# Patient Record
Sex: Female | Born: 1970
Health system: Southern US, Community
[De-identification: ages and names within clinical notes are randomized; demographics above are authoritative.]

## PROBLEM LIST (undated history)

## (undated) DIAGNOSIS — F419 Anxiety disorder, unspecified: Secondary | ICD-10-CM

## (undated) DIAGNOSIS — F988 Other specified behavioral and emotional disorders with onset usually occurring in childhood and adolescence: Secondary | ICD-10-CM

## (undated) DIAGNOSIS — I10 Essential (primary) hypertension: Secondary | ICD-10-CM

## (undated) HISTORY — PX: MICROLARYNGOSCOPY WITH CO2 LASER AND EXCISION OF VOCAL CORD LESION: SHX5970

## (undated) HISTORY — PX: ELBOW SURGERY: SHX618

## (undated) HISTORY — PX: VENTRICULOPERITONEAL SHUNT: SHX204

## (undated) HISTORY — PX: APPENDECTOMY: SHX54

## (undated) HISTORY — PX: ABDOMINAL HYSTERECTOMY: SHX81

---

## 2017-04-10 DIAGNOSIS — N951 Menopausal and female climacteric states: Secondary | ICD-10-CM | POA: Diagnosis not present

## 2017-04-10 DIAGNOSIS — I10 Essential (primary) hypertension: Secondary | ICD-10-CM | POA: Diagnosis not present

## 2017-04-10 DIAGNOSIS — R51 Headache: Secondary | ICD-10-CM | POA: Diagnosis not present

## 2017-04-10 DIAGNOSIS — G2581 Restless legs syndrome: Secondary | ICD-10-CM | POA: Diagnosis not present

## 2017-04-10 DIAGNOSIS — R609 Edema, unspecified: Secondary | ICD-10-CM | POA: Diagnosis not present

## 2017-04-10 DIAGNOSIS — Z1231 Encounter for screening mammogram for malignant neoplasm of breast: Secondary | ICD-10-CM | POA: Diagnosis not present

## 2017-04-10 DIAGNOSIS — F419 Anxiety disorder, unspecified: Secondary | ICD-10-CM | POA: Diagnosis not present

## 2017-04-10 DIAGNOSIS — M25521 Pain in right elbow: Secondary | ICD-10-CM | POA: Diagnosis not present

## 2017-04-10 DIAGNOSIS — K219 Gastro-esophageal reflux disease without esophagitis: Secondary | ICD-10-CM | POA: Diagnosis not present

## 2017-04-14 DIAGNOSIS — I10 Essential (primary) hypertension: Secondary | ICD-10-CM | POA: Diagnosis not present

## 2017-04-16 ENCOUNTER — Ambulatory Visit: Payer: Self-pay | Admitting: Adult Health

## 2017-04-17 DIAGNOSIS — R7303 Prediabetes: Secondary | ICD-10-CM | POA: Diagnosis not present

## 2017-04-17 DIAGNOSIS — I10 Essential (primary) hypertension: Secondary | ICD-10-CM | POA: Diagnosis not present

## 2017-04-17 DIAGNOSIS — F419 Anxiety disorder, unspecified: Secondary | ICD-10-CM | POA: Diagnosis not present

## 2017-04-17 DIAGNOSIS — G2581 Restless legs syndrome: Secondary | ICD-10-CM | POA: Diagnosis not present

## 2017-04-22 DIAGNOSIS — I10 Essential (primary) hypertension: Secondary | ICD-10-CM | POA: Diagnosis not present

## 2017-04-22 DIAGNOSIS — F419 Anxiety disorder, unspecified: Secondary | ICD-10-CM | POA: Diagnosis not present

## 2017-04-23 ENCOUNTER — Other Ambulatory Visit: Payer: Self-pay | Admitting: Family Medicine

## 2017-04-23 DIAGNOSIS — Z1231 Encounter for screening mammogram for malignant neoplasm of breast: Secondary | ICD-10-CM

## 2017-05-05 ENCOUNTER — Ambulatory Visit: Payer: Self-pay

## 2017-05-06 ENCOUNTER — Ambulatory Visit
Admission: RE | Admit: 2017-05-06 | Discharge: 2017-05-06 | Disposition: A | Discharge status: Home / Self Care | Payer: 59 | Source: Ambulatory Visit | Attending: Family Medicine | Admitting: Family Medicine

## 2017-05-06 DIAGNOSIS — Z1231 Encounter for screening mammogram for malignant neoplasm of breast: Secondary | ICD-10-CM

## 2017-05-18 MED FILL — GABAPENTIN 300 MG CAPSULE: 300 | 30 days supply | Qty: 30 | Fill #0

## 2017-05-18 MED FILL — CITALOPRAM HBR 40 MG TABLET: 40 | 30 days supply | Qty: 30 | Fill #0

## 2017-05-18 MED FILL — AMLODIPINE BESYLATE 5 MG TA: 5 | 90 days supply | Qty: 90 | Fill #0

## 2017-05-22 DIAGNOSIS — F902 Attention-deficit hyperactivity disorder, combined type: Secondary | ICD-10-CM | POA: Diagnosis not present

## 2017-05-22 DIAGNOSIS — F411 Generalized anxiety disorder: Secondary | ICD-10-CM | POA: Diagnosis not present

## 2017-05-22 MED FILL — traZODone HCL 100 MG TABS: 100 | 90 days supply | Qty: 90 | Fill #0

## 2017-05-22 MED FILL — SERTRALINE HCL 100 MG TAB: 100 | 30 days supply | Qty: 30 | Fill #0

## 2017-05-22 MED FILL — ALPRAZolam 0.5 MG TABS: 0.5 | 30 days supply | Qty: 30 | Fill #0

## 2017-05-25 MED FILL — VYVANSE 30 MG CAPSULE: 30 | 30 days supply | Qty: 30 | Fill #0

## 2017-06-21 ENCOUNTER — Emergency Department (HOSPITAL_COMMUNITY): Payer: 59

## 2017-06-21 ENCOUNTER — Emergency Department (HOSPITAL_COMMUNITY)
Admission: EM | Admit: 2017-06-21 | Discharge: 2017-06-21 | Disposition: A | Discharge status: Home / Self Care | Payer: 59 | Attending: Emergency Medicine | Admitting: Emergency Medicine

## 2017-06-21 ENCOUNTER — Encounter (HOSPITAL_COMMUNITY): Payer: Self-pay

## 2017-06-21 DIAGNOSIS — I10 Essential (primary) hypertension: Secondary | ICD-10-CM | POA: Diagnosis not present

## 2017-06-21 DIAGNOSIS — F1099 Alcohol use, unspecified with unspecified alcohol-induced disorder: Secondary | ICD-10-CM | POA: Diagnosis not present

## 2017-06-21 DIAGNOSIS — R112 Nausea with vomiting, unspecified: Secondary | ICD-10-CM | POA: Insufficient documentation

## 2017-06-21 DIAGNOSIS — R0789 Other chest pain: Secondary | ICD-10-CM

## 2017-06-21 DIAGNOSIS — R072 Precordial pain: Secondary | ICD-10-CM | POA: Diagnosis not present

## 2017-06-21 DIAGNOSIS — R079 Chest pain, unspecified: Secondary | ICD-10-CM | POA: Diagnosis not present

## 2017-06-21 HISTORY — DX: Anxiety disorder, unspecified: F41.9

## 2017-06-21 HISTORY — DX: Essential (primary) hypertension: I10

## 2017-06-21 HISTORY — DX: Other specified behavioral and emotional disorders with onset usually occurring in childhood and adolescence: F98.8

## 2017-06-21 LAB — URINALYSIS, ROUTINE W REFLEX MICROSCOPIC
Bilirubin Urine: NEGATIVE
GLUCOSE, UA: NEGATIVE mg/dL
Hgb urine dipstick: NEGATIVE
Ketones, ur: NEGATIVE mg/dL
LEUKOCYTES UA: NEGATIVE
Nitrite: NEGATIVE
PROTEIN: NEGATIVE mg/dL
SPECIFIC GRAVITY, URINE: 1.004 — AB (ref 1.005–1.030)
pH: 6 (ref 5.0–8.0)

## 2017-06-21 LAB — BASIC METABOLIC PANEL
Anion gap: 10 (ref 5–15)
BUN: 12 mg/dL (ref 6–20)
CALCIUM: 9.1 mg/dL (ref 8.9–10.3)
CHLORIDE: 107 mmol/L (ref 101–111)
CO2: 23 mmol/L (ref 22–32)
CREATININE: 0.91 mg/dL (ref 0.44–1.00)
GFR calc Af Amer: >60 mL/min (ref >60)
GFR calc non Af Amer: >60 mL/min (ref >60)
GLUCOSE: 107 mg/dL — AB (ref 65–99)
Potassium: 3.4 mmol/L — ABNORMAL LOW (ref 3.5–5.1)
Sodium: 140 mmol/L (ref 135–145)

## 2017-06-21 LAB — CBC
HCT: 40.8 % (ref 36.0–46.0)
Hemoglobin: 14.3 g/dL (ref 12.0–15.0)
MCH: 29.7 pg (ref 26.0–34.0)
MCHC: 35 g/dL (ref 30.0–36.0)
MCV: 84.6 fL (ref 78.0–100.0)
PLATELETS: 254 10*3/uL (ref 150–400)
RBC: 4.82 MIL/uL (ref 3.87–5.11)
RDW: 13.4 % (ref 11.5–15.5)
WBC: 14.2 10*3/uL — ABNORMAL HIGH (ref 4.0–10.5)

## 2017-06-21 LAB — I-STAT CHEM 8, ED
BUN: 13 mg/dL (ref 6–20)
CALCIUM ION: 1.13 mmol/L — AB (ref 1.15–1.40)
CHLORIDE: 105 mmol/L (ref 101–111)
CREATININE: 0.9 mg/dL (ref 0.44–1.00)
Glucose, Bld: 104 mg/dL — ABNORMAL HIGH (ref 65–99)
HCT: 40 % (ref 36.0–46.0)
Hemoglobin: 13.6 g/dL (ref 12.0–15.0)
Potassium: 3.2 mmol/L — ABNORMAL LOW (ref 3.5–5.1)
SODIUM: 141 mmol/L (ref 135–145)
TCO2: 24 mmol/L (ref 0–100)

## 2017-06-21 LAB — TROPONIN I: Troponin I: <0.03 ng/mL (ref <0.03)

## 2017-06-21 LAB — I-STAT TROPONIN, ED: Troponin i, poc: 0 ng/mL (ref 0.00–0.08)

## 2017-06-21 LAB — LIPASE, BLOOD: Lipase: 36 U/L (ref 11–51)

## 2017-06-21 LAB — D-DIMER, QUANTITATIVE (NOT AT ARMC): D DIMER QUANT: 0.45 ug{FEU}/mL (ref 0.00–0.50)

## 2017-06-21 MED ORDER — ONDANSETRON HCL 4 MG/2ML IJ SOLN
4.0000 mg | Freq: Once | INTRAMUSCULAR | Status: AC
  Administered 2017-06-21: 4 mg via INTRAVENOUS
  Filled 2017-06-21: qty 2

## 2017-06-21 MED ORDER — IOPAMIDOL (ISOVUE-370) INJECTION 76%
INTRAVENOUS | Status: AC
  Administered 2017-06-21: 100 mL
  Filled 2017-06-21: qty 100

## 2017-06-21 MED ORDER — OMEPRAZOLE 20 MG PO CPDR: 20.0000 mg | DELAYED_RELEASE_CAPSULE | Freq: Every day | ORAL | 0 refills | Status: DC

## 2017-06-21 MED ORDER — MORPHINE SULFATE (PF) 4 MG/ML IV SOLN
4.0000 mg | Freq: Once | INTRAVENOUS | Status: AC
  Administered 2017-06-21: 4 mg via INTRAVENOUS
  Filled 2017-06-21: qty 1

## 2017-06-21 MED ORDER — GI COCKTAIL ~~LOC~~
30.0000 mL | Freq: Once | ORAL | Status: AC
  Administered 2017-06-21: 30 mL via ORAL
  Filled 2017-06-21: qty 30

## 2017-06-21 MED ORDER — SODIUM CHLORIDE 0.9 % IV BOLUS (SEPSIS)
1000.0000 mL | Freq: Once | INTRAVENOUS | Status: AC
  Administered 2017-06-21: 1000 mL via INTRAVENOUS

## 2017-06-21 NOTE — ED Notes (Signed)
Patient transported to X-ray 

## 2017-06-21 NOTE — ED Provider Notes (Signed)
[facility]-EMERGENCY DEPT Provider Note   CSN: [id number] Arrival date & time: 06/21/17  4098  By signing my name below, I, Linna Darner, attest that this documentation has been prepared under the direction and in the presence of physician practitioner, Shizue Kaseman, Jeannett Senior, MD. Electronically Signed: Linna Darner, Scribe. 06/21/2017. 3:03 AM.  History   Chief Complaint Chief Complaint  Patient presents with  . Chest Pain   The history is provided by the patient. No language interpreter was used.    HPI Comments: Sylvia Walker is a 46 y.o. female with PMHx including HTN and anxiety who presents to the Emergency Department via EMS complaining of sudden onset, constant substernal chest pain that began around midnight. The pain presented while she was lying down at home. It radiates into her back and right upper extremity. The pain in the back is described as stabbing and the chest pain is described as ripping. There is associated nausea without vomiting as well as some dizziness. She notes that she had an unusual episode of diaphoresis while she was outside at a friend's house drinking alcohol (four glasses of Coke and rum) about an hour prior to onset of her chest pain. She did not have any chest pain during the episode of diaphoresis and felt normal upon leaving her friend's house. There are no modifying factors noted; patient was given nitroglycerin by EMS and also took aspirin 324 mg at home without improvement. Patient was immobilized for about 8 hours in a car yesterday. Patient has not eaten any unusual foods recently. She has no prior h/o the same. She has no h/o MI or coronary stent placement. No h/o stress test. Patient has a remote history of GERD but does not currently use any prescription medications for reflux. She denies abdominal pain or any other associated symptoms.  Past Medical History:  Diagnosis Date  . ADD (attention deficit disorder)   . Anxiety   . Hypertension     There  are no active problems to display for this patient.   History reviewed. No pertinent surgical history.  OB History    No data available       Home Medications    Prior to Admission medications   Not on File    Family History No family history on file.  Social History Social History  Substance Use Topics  . Smoking status: Not on file  . Smokeless tobacco: Not on file  . Alcohol use Yes     Allergies   Penicillins   Review of Systems Review of Systems All other systems reviewed and are negative for acute change except as noted in the HPI. Physical Exam Updated Vital Signs BP 133/80   Pulse 86   Resp 17   SpO2 93%   Physical Exam  Constitutional: She is oriented to person, place, and time. She appears well-developed and well-nourished. No distress.  Uncomfortable.  HENT:  Head: Normocephalic and atraumatic.  Mouth/Throat: Oropharynx is clear and moist. No oropharyngeal exudate.  Eyes: Pupils are equal, round, and reactive to light. Conjunctivae and EOM are normal.  Neck: Normal range of motion. Neck supple.  No meningismus.  Cardiovascular: Normal rate, regular rhythm, normal heart sounds and intact distal pulses.   No murmur heard. Pulses:      Radial pulses are 2+ on the right side, and 2+ on the left side.  Pulmonary/Chest: Effort normal and breath sounds normal. No respiratory distress.  Lungs are clear to auscultation.  Abdominal: Soft. There is no  tenderness. There is no rebound and no guarding.  Musculoskeletal: Normal range of motion. She exhibits no edema or tenderness.  Neurological: She is alert and oriented to person, place, and time. No cranial nerve deficit. She exhibits normal muscle tone. Coordination normal.  5/5 strength throughout. CN 2-12 intact.Equal grip strength.   Skin: Skin is warm.  Psychiatric: She has a normal mood and affect. Her behavior is normal.  Nursing note and vitals reviewed.  ED Treatments / Results  Labs (all  labs ordered are listed, but only abnormal results are displayed) Labs Reviewed  BASIC METABOLIC PANEL - Abnormal; Notable for the following:       Result Value   Potassium 3.4 (*)    Glucose, Bld 107 (*)    All other components within normal limits  CBC - Abnormal; Notable for the following:    WBC 14.2 (*)    All other components within normal limits  URINALYSIS, ROUTINE W REFLEX MICROSCOPIC - Abnormal; Notable for the following:    Color, Urine STRAW (*)    Specific Gravity, Urine 1.004 (*)    All other components within normal limits  I-STAT CHEM 8, ED - Abnormal; Notable for the following:    Potassium 3.2 (*)    Glucose, Bld 104 (*)    Calcium, Ion 1.13 (*)    All other components within normal limits  LIPASE, BLOOD  TROPONIN I  D-DIMER, QUANTITATIVE (NOT AT [facility])  TROPONIN I  I-STAT TROPONIN, ED  I-STAT TROPONIN, ED    EKG  EKG Interpretation  Date/Time:  Sunday June 21 2017 05:37:25 EDT Ventricular Rate:  87 PR Interval:    QRS Duration: 102 QT Interval:  394 QTC Calculation: 474 R Axis:   58 Text Interpretation:  Sinus rhythm No significant change was found Confirmed by Glynn Octaveancour, Arisa Congleton [id number]) on 06/21/2017 5:48:18 AM       Radiology Dg Chest Portable 1 View  Result Date: 06/21/2017 CLINICAL DATA:  Acute onset of generalized chest pain, radiating to the right arm and neck. Nausea and dizziness. Initial encounter. EXAM: PORTABLE CHEST 1 VIEW COMPARISON:  None. FINDINGS: The lungs are well-aerated and clear. There is no evidence of focal opacification, pleural effusion or pneumothorax. The cardiomediastinal silhouette is within normal limits. No acute osseous abnormalities are seen. IMPRESSION: No acute cardiopulmonary process seen. Electronically Signed   By: Roanna RaiderJeffery  Chang M.D.   On: 06/21/2017 03:15   Ct Angio Chest/abd/pel For Dissection W And/or Wo Contrast  Result Date: 06/21/2017 CLINICAL DATA:  Acute onset of substernal chest pain, radiating to the  right arm and back. Initial encounter. EXAM: CT ANGIOGRAPHY CHEST, ABDOMEN AND PELVIS TECHNIQUE: Multidetector CT imaging through the chest, abdomen and pelvis was performed using the standard protocol during bolus administration of intravenous contrast. Multiplanar reconstructed images and MIPs were obtained and reviewed to evaluate the vascular anatomy. CONTRAST:  100 mL of Isovue 370 IV contrast COMPARISON:  Chest radiograph performed earlier today at 3:02 a.m. FINDINGS: CTA CHEST FINDINGS Cardiovascular: There is no evidence of aortic dissection. There is no evidence of aneurysmal dilatation. Mild calcification is noted along the aortic arch. There is no evidence of significant pulmonary embolus. Mediastinum/Nodes: The heart is normal in size. Visualized mediastinal nodes are borderline normal in size. No pericardial effusion is identified. The visualized portions of the thyroid gland are unremarkable. No axillary lymphadenopathy is seen. Lungs/Pleura: The lungs are clear bilaterally. No focal consolidation, pleural effusion or pneumothorax is seen. No masses are identified. Musculoskeletal: No  acute osseous abnormalities are identified. The visualized musculature is unremarkable in appearance. Review of the MIP images confirms the above findings. CTA ABDOMEN AND PELVIS FINDINGS VASCULAR Aorta: There is no evidence of aortic dissection. There is no evidence of aneurysmal dilatation. Mild scattered calcification is noted along the distal abdominal aorta and its branches, with minimal mural thrombus. No significant luminal narrowing is seen. Celiac: The celiac trunk appears intact. SMA: The superior mesenteric artery is unremarkable in appearance. Renals: The renal arteries appear intact bilaterally. Two left-sided renal arteries are seen. IMA: The inferior mesenteric artery is unremarkable in appearance. Inflow: Scattered calcification is noted along the common and internal iliac arteries bilaterally. The  external iliac arteries appear intact bilaterally. Minimal calcification is noted along the common femoral arteries bilaterally. Veins: Visualized venous structures are grossly unremarkable in appearance. Review of the MIP images confirms the above findings. NON-VASCULAR Hepatobiliary: The liver is unremarkable in appearance. The patient is status post cholecystectomy, with clips noted at the gallbladder fossa. The common bile duct remains normal in caliber. Pancreas: The pancreas is within normal limits. Spleen: The spleen is unremarkable in appearance. Adrenals/Urinary Tract: The adrenal glands are unremarkable in appearance. The kidneys are within normal limits. There is no evidence of hydronephrosis. No renal or ureteral stones are identified. No perinephric stranding is seen. Stomach/Bowel: The stomach is unremarkable in appearance. The small bowel is within normal limits. The patient is status post appendectomy. The colon is unremarkable in appearance. Lymphatic: No retroperitoneal or pelvic sidewall lymphadenopathy is seen. Reproductive: The bladder is mildly distended. Mild soft tissue inflammation about the bladder may reflect cystitis. There is asymmetric prominence of the left ovary, measuring 5.1 cm in size. The right ovary is unremarkable in appearance. The patient is status post hysterectomy. Other: A catheter is seen extending into the upper pelvis. Its proximal end extends to the upper thoracic spine. Musculoskeletal: No acute osseous abnormalities are identified. Vacuum phenomenon is noted at L4-L5. The visualized musculature is unremarkable in appearance. Review of the MIP images confirms the above findings. IMPRESSION: 1. No evidence of aortic dissection. No evidence of aneurysmal dilatation. 2. No evidence of significant pulmonary embolus. 3. Mild soft tissue inflammation about the bladder may reflect cystitis. Would correlate for any associated symptoms. 4. Asymmetric prominence of the left  ovary, measuring 5.1 cm in size. Pelvic ultrasound could be considered for further evaluation, when and as deemed clinically appropriate. 5. Mild aortic atherosclerosis. Electronically Signed   By: Roanna Raider M.D.   On: 06/21/2017 03:50    Procedures Procedures (including critical care time)  DIAGNOSTIC STUDIES: Oxygen Saturation is 98% on RA, normal by my interpretation.    COORDINATION OF CARE: 2:55 AM Discussed treatment plan with pt at bedside and pt agreed to plan.  Medications Ordered in ED Medications  morphine 4 MG/ML injection 4 mg (4 mg Intravenous Given 06/21/17 0302)  ondansetron (ZOFRAN) injection 4 mg (4 mg Intravenous Given 06/21/17 0303)     Initial Impression / Assessment and Plan / ED Course  I have reviewed the triage vital signs and the nursing notes.  Pertinent labs & imaging results that were available during my care of the patient were reviewed by me and considered in my medical decision making (see chart for details).    Central chest pain radiating to back. Started after lying down.  Drank alcohol earlier. EKG nsr. CXR negative. Troponin negative.   CTA negative for dissection or PE.  Troponin negative x2. D-dimer negative.  Heart  score 3.    Observation admission offered but Patient does not want to be admitted.   Feels improved after treatment in the ED.  PPI and GI cocktail given.  Question esophageal spasm. Doubt ACS with unchanged EKG and two negative troponins.  No evidence of PE or aortic dissection.  Patient anxious to go home.  Start PPI. Followup with PCP for stress test. Return precautions discussed.  Final Clinical Impressions(s) / ED Diagnoses   Final diagnoses:  Atypical chest pain    New Prescriptions New Prescriptions   No medications on file  I personally performed the services described in this documentation, which was scribed in my presence. The recorded information has been reviewed and is accurate.    Glynn Octave, MD 06/22/17 [phone]

## 2017-06-21 NOTE — ED Triage Notes (Signed)
Pt from home with ems c.o substernal CP radiating to right arm and back between her shoulder blades. Pt describes it as a stabbing pain and "someone sitting on my chest" Pt nauseated, 4 zofram OTD given en route along with 324 ASA and 3 Nitros without relief of pain. Pain now 5/10. Pt alert/oriented, VSS 20GLHand, nad

## 2017-06-21 NOTE — Discharge Instructions (Signed)
There is no evidence of heart attack or blood clot in the lung. No aortic dissection. Restart your prilosec. Follow up with your doctor for a stress test. Return to the ED if you develop new or worsening symptoms.

## 2017-06-22 DIAGNOSIS — N959 Unspecified menopausal and perimenopausal disorder: Secondary | ICD-10-CM | POA: Diagnosis not present

## 2017-06-22 DIAGNOSIS — E876 Hypokalemia: Secondary | ICD-10-CM | POA: Diagnosis not present

## 2017-06-22 DIAGNOSIS — R0600 Dyspnea, unspecified: Secondary | ICD-10-CM | POA: Diagnosis not present

## 2017-06-22 DIAGNOSIS — R0789 Other chest pain: Secondary | ICD-10-CM | POA: Diagnosis not present

## 2017-06-22 DIAGNOSIS — F17210 Nicotine dependence, cigarettes, uncomplicated: Secondary | ICD-10-CM | POA: Diagnosis not present

## 2017-06-22 DIAGNOSIS — K219 Gastro-esophageal reflux disease without esophagitis: Secondary | ICD-10-CM | POA: Diagnosis not present

## 2017-06-22 MED FILL — ESTRADIOL 1 MG TABLET: 1 | 30 days supply | Qty: 30 | Fill #0

## 2017-06-23 DIAGNOSIS — F902 Attention-deficit hyperactivity disorder, combined type: Secondary | ICD-10-CM | POA: Diagnosis not present

## 2017-06-23 MED FILL — traMADol HCL 50 MG TABS: 50 | 5 days supply | Qty: 20 | Fill #0

## 2017-06-23 MED FILL — SERTRALINE HCL 100 MG TAB: 100 | 30 days supply | Qty: 30 | Fill #0

## 2017-06-23 MED FILL — VYVANSE 30 MG CAPSULE: 30 | 30 days supply | Qty: 30 | Fill #0

## 2017-06-23 MED FILL — GABAPENTIN 300 MG CAPS: 300 | 30 days supply | Qty: 30 | Fill #0

## 2017-06-23 MED FILL — ALPRAZolam 0.5 MG TABS: 0.5 | 23 days supply | Qty: 45 | Fill #0

## 2017-06-24 ENCOUNTER — Ambulatory Visit (INDEPENDENT_AMBULATORY_CARE_PROVIDER_SITE_OTHER): Payer: 59 | Admitting: Cardiology

## 2017-06-24 ENCOUNTER — Encounter: Payer: Self-pay | Admitting: Cardiology

## 2017-06-24 ENCOUNTER — Telehealth: Payer: Self-pay

## 2017-06-24 VITALS — BP 124/68 | HR 96 | Resp 10 | Ht 67.0 in | Wt 224.0 lb

## 2017-06-24 DIAGNOSIS — I7090 Unspecified atherosclerosis: Secondary | ICD-10-CM

## 2017-06-24 DIAGNOSIS — I209 Angina pectoris, unspecified: Secondary | ICD-10-CM | POA: Diagnosis not present

## 2017-06-24 DIAGNOSIS — I10 Essential (primary) hypertension: Secondary | ICD-10-CM | POA: Insufficient documentation

## 2017-06-24 DIAGNOSIS — F17210 Nicotine dependence, cigarettes, uncomplicated: Secondary | ICD-10-CM

## 2017-06-24 MED ORDER — ASPIRIN 81 MG PO CHEW: 81.0000 mg | CHEWABLE_TABLET | Freq: Every day | ORAL | 0 refills | Status: AC

## 2017-06-24 MED ORDER — NITROGLYCERIN 0.4 MG SL SUBL: 0.4000 mg | SUBLINGUAL_TABLET | SUBLINGUAL | 1 refills | Status: AC | PRN

## 2017-06-24 NOTE — Progress Notes (Signed)
Cardiology Office Note:    Date:  06/24/2017   ID:  Sylvia Walker, DOB 10/20/1971, MRN [id number]  PCP:  Walker, Marian, NP  Cardiologist:  Sylvia Ocain R Talasia Saulter, MD   Referring MD: Walker, Marian, NP    ASSESSMENT:    1. Angina pectoris (HCC)   2. Essential hypertension   3. Morbid obesity (HCC)   4. Atherosclerotic vascular disease   5. Cigarette smoker    PLAN:    In order of problems listed above:  1. Patient's symptoms are very concerning. She has multiple risk factors for coronary artery disease. Sublingual nitroglycerin protocol 911 protocol explained and she verbalized understanding. Prescription was sent.In view of the patient's symptoms, I discussed with the patient options for evaluation. Invasive and noninvasive options were given to the patient. I discussed stress testing and coronary angiography and left heart catheterization at length. Benefits, pros and cons of each approach were discussed at length. Patient had multiple questions which were answered to the patient's satisfaction. Patient opted for invasive evaluation and we will set up for coronary angiography and left heart catheterization. Further recommendations will be made based on the findings with coronary angiography. In the interim if the patient has any significant symptoms in hospital to the nearest emergency room. 2. Her blood pressure stable at this time. Diet was discussed for obesity and this of obesity explained she localized understanding. Her lipids need to be followed by her primary care physician. Once the coronary angiography stent she needs to be on statin therapy and this will have to be addressed by her primary care physician. I told her to take a coated baby aspirin on a regular basis. She'll be seen in follow-up appointment after the coronary angiography. Primary prevention stressed. I advised her to diet to lose weight and risks of obesity explained. She vocalized understanding and questions were  answered to her satisfaction. 3.   I spent 5 minutes with the patient discussing solely about smoking. Smoking cessation was counseled. I suggested to the patient also different medications and pharmacological interventions. Patient is keen to try stopping on its own at this time. He will get back to me if he needs any further assistance in this matter.   Medication Adjustments/Labs and Tests Ordered: Current medicines are reviewed at length with the patient today.  Concerns regarding medicines are outlined above.  No orders of the defined types were placed in this encounter.  No orders of the defined types were placed in this encounter.    History of Present Illness:    Sylvia Walker is a 46 y.o. female who is being seen today for the evaluatiIs on of Angina pectoris at the request of Walker, Marian, NP. Patient is a pleasant 46-year-old female. 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 She mentions to me that she has history of essential hypertension and also she has morbid obesity. She leads a sedentary lifestyle. Unfortunately she's been a heavy smoker. This is been for a very long period of time. She mentioned to me that the other day she was at a party and subsequently when she got home she has substernal chest tightness. She uses the words elephants sitting on the chest describe this. This went to the neck and to the arms. EMS was called and she was evaluated. I reviewed the hospital evaluation. CT scan of the chest ruled out pulmonary embolism but revealed atherosclerotic vascular disease and the patient is aware of it. At the time of my   evaluation she is alert awake oriented and in no distress. Her husband is accompanying her and is very supportive.  Past Medical History:  Diagnosis Date  . ADD (attention deficit disorder)   . Anxiety   . Hypertension     Past Surgical History:  Procedure Laterality Date  . ABDOMINAL HYSTERECTOMY    . APPENDECTOMY    . ELBOW SURGERY      x2  . MICROLARYNGOSCOPY WITH CO2 LASER AND EXCISION OF VOCAL CORD LESION    . VENTRICULOPERITONEAL SHUNT      Current Medications: Current Meds  Medication Sig  . ALPRAZolam (XANAX) 0.5 MG tablet Take 0.5 mg by mouth daily as needed for anxiety.   . amLODipine (NORVASC) 5 MG tablet Take 5 mg by mouth daily.  . cyclobenzaprine (FLEXERIL) 10 MG tablet Take 10 mg by mouth as needed for muscle spasms.  . diphenhydrAMINE (BENADRYL) 25 mg capsule Take 25 mg by mouth at bedtime.  . estradiol (ESTRACE) 1 MG tablet Take 1 mg by mouth daily.   . gabapentin (NEURONTIN) 300 MG capsule Take 300 mg by mouth daily.   . lisdexamfetamine (VYVANSE) 30 MG capsule Take 30 mg by mouth daily.  . Multiple Vitamin (MULTIVITAMIN WITH MINERALS) TABS tablet Take 1 tablet by mouth daily.  . omeprazole (PRILOSEC) 20 MG capsule Take 1 capsule (20 mg total) by mouth daily.  . Prenatal Vit-Fe Fumarate-FA (PRENATAL MULTIVITAMIN) TABS tablet Take 1 tablet by mouth daily at 12 noon.  . sertraline (ZOLOFT) 100 MG tablet Take 100 mg by mouth daily.  . traMADol (ULTRAM) 50 MG tablet Take by mouth as needed for severe pain.  . traZODone (DESYREL) 100 MG tablet Take 100 mg by mouth at bedtime.     Allergies:   Penicillins   Social History   Social History  . Marital status: Married    Spouse name: N/A  . Number of children: N/A  . Years of education: N/A   Social History Main Topics  . Smoking status: Current Every Day Smoker    Packs/day: 1.00  . Smokeless tobacco: Never Used  . Alcohol use Yes  . Drug use: No  . Sexual activity: Not Asked   Other Topics Concern  . None   Social History Narrative  . None     Family History: The patient's family history includes COPD in her mother; Hypertension in her mother; Prostate cancer in her father; Renal Disease in her mother; Stroke in her mother.  ROS:   Please see the history of present illness.    All other systems reviewed and are  negative.  EKGs/Labs/Other Studies Reviewed:    The following studies were reviewed today: I reviewed records from the emergency room visit and discussed this with the patient at extensive length. She had multiple questions which were answered to her satisfaction.   Recent Labs: 06/21/2017: BUN 13; Creatinine, Ser 0.90; Hemoglobin 13.6; Platelets 254; Potassium 3.2; Sodium 141  Recent Lipid Panel No results found for: CHOL, TRIG, HDL, CHOLHDL, VLDL, LDLCALC, LDLDIRECT  Physical Exam:    VS:  BP 124/68   Pulse 96   Resp 10   Ht 5' 7" (1.702 m)   Wt 224 lb (101.6 kg)   BMI 35.08 kg/m     Wt Readings from Last 3 Encounters:  06/24/17 224 lb (101.6 kg)     GEN: Patient is in no acute distress HEENT: Normal NECK: No JVD; No carotid bruits LYMPHATICS: No lymphadenopathy CARDIAC: S1 S2 regular, 2/6   systolic murmur at the apex. RESPIRATORY:  Clear to auscultation without rales, wheezing or rhonchi  ABDOMEN: Soft, non-tender, non-distended MUSCULOSKELETAL:  No edema; No deformity  SKIN: Warm and dry NEUROLOGIC:  Alert and oriented x 3 PSYCHIATRIC:  Normal affect    Signed, Breta Demedeiros R Caster Fayette, MD  06/24/2017 11:15 AM    [facility]   

## 2017-06-24 NOTE — Telephone Encounter (Signed)
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

## 2017-06-24 NOTE — Patient Instructions (Addendum)
Medication Instructions:  Your physician recommends that you continue on your current medications as directed. Please refer to the Current Medication list given to you today.  Labwork: None   Testing/Procedures: Your physician has requested that you have a cardiac catheterization. Cardiac catheterization is used to diagnose and/or treat various heart conditions. Doctors may recommend this procedure for a number of different reasons. The most common reason is to evaluate chest pain. Chest pain can be a symptom of coronary artery disease (CAD), and cardiac catheterization can show whether plaque is narrowing or blocking your heart's arteries. This procedure is also used to evaluate the valves, as well as measure the blood flow and oxygen levels in different parts of your heart. For further information please visit [email or web address]. Please follow instruction sheet, as given.   Your provider has recommended a cardiac catherization  You are scheduled for a cardiac catheterization on Friday, June 26, 2017 with Dr. Clifton JamesMcAlhany or associate.  Please arrive at the [facility] ([facility]) at [facility] at [address], [place] -  2nd Floor Short Stay on Friday, June 26, 2017 at 10:00 a.m.    Special note: Every effort is made to have your procedure done on time.   Please understand that emergencies sometimes delay a scheduled   procedure.  No food or drink after midnight on Thursday, June 25, 2017. On the morning of your procedure, take your Aspirin.    You may take your morning medications with a sip of water on the day of your procedure.  Medications to HOLD - NONE   Plan for a one night stay -- bring personal belongings.  Bring a current list of your medications and current insurance cards.  You MUST have a responsible person to drive you home. Someone MUST be with you the first 24 hours after you arrive home or your discharge will be delayed. Wear clothes that are easy  to get on and off and wear slip on shoes.    Coronary Angiogram A coronary angiogram, also called coronary angiography, is an X-ray procedure used to look at the arteries in the heart. In this procedure, a dye (contrast dye) is injected through a long, hollow tube (catheter). The catheter is about the size of a piece of cooked spaghetti and is inserted through your groin, wrist, or arm. The dye is injected into each artery, and X-rays are then taken to show if there is a blockage in the arteries of your heart.  LET [facility]ARE PROVIDER KNOW ABOUT:  Any allergies you have, including allergies to shellfish or contrast dye.   All medicines you are taking, including vitamins, herbs, eye drops, creams, and over-the-counter medicines.   Previous problems you or members of your family have had with the use of anesthetics.   Any blood disorders you have.   Previous surgeries you have had.  History of kidney problems or failure.   Other medical conditions you have.  RISKS AND COMPLICATIONS  Generally, a coronary angiogram is a safe procedure. However, about 1 person out of 1000 can have problems that may include:  Allergic reaction to the dye.  Bleeding/bruising from the access site or other locations.  Kidney injury, especially in people with impaired kidney function.  Stroke (rare).  Heart attack (rare).  Irregular rhythms (rare)  Death (rare)  BEFORE THE PROCEDURE   Do not eat or drink anything after midnight the night before the procedure or as directed by your health care provider.  Ask your health care provider about changing or stopping your regular medicines. This is especially important if you are taking diabetes medicines or blood thinners.  PROCEDURE  You may be given a medicine to help you relax (sedative) before the procedure. This medicine is given through an intravenous (IV) access tube that is inserted into one of your veins.   The area where the  catheter will be inserted will be washed and shaved. This is usually done in the groin but may be done in the fold of your arm (near your elbow) or in the wrist.   A medicine will be given to numb the area where the catheter will be inserted (local anesthetic).   The health care provider will insert the catheter into an artery. The catheter will be guided by using a special type of X-ray (fluoroscopy) of the blood vessel being examined.   A special dye will then be injected into the catheter, and X-rays will be taken. The dye will help to show where any narrowing or blockages are located in the heart arteries.    AFTER THE PROCEDURE   If the procedure is done through the leg, you will be kept in bed lying flat for several hours. You will be instructed to not bend or cross your legs.  The insertion site will be checked frequently.   The pulse in your feet or wrist will be checked frequently.   Additional blood tests, X-rays, and an electrocardiogram may be done.     Follow-Up: Your physician recommends that you schedule a follow-up appointment in: 7-10 days after the cath.   Any Other Special Instructions Will Be Listed Below (If Applicable).  Please note that any paperwork needing to be filled out by the provider will need to be addressed at the front desk prior to seeing the provider. Please note that any paperwork FMLA, Disability or other documents regarding health condition is subject to a $25.00 charge that must be received prior to completion of paperwork in the form of a money order or check.    If you need a refill on your cardiac medications before your next appointment, please call your pharmacy.

## 2017-06-24 NOTE — Addendum Note (Signed)
Addended by: Rodney LangtonITTER, JADA M on: 06/24/2017 11:42 AM   Modules accepted: Orders

## 2017-06-26 ENCOUNTER — Ambulatory Visit (HOSPITAL_COMMUNITY)
Admission: RE | Admit: 2017-06-26 | Discharge: 2017-06-26 | Disposition: A | Discharge status: Home / Self Care | Payer: 59 | Source: Ambulatory Visit | Attending: Cardiovascular Disease | Admitting: Cardiovascular Disease

## 2017-06-26 ENCOUNTER — Encounter: Payer: Self-pay | Admitting: Cardiovascular Disease

## 2017-06-26 ENCOUNTER — Encounter (HOSPITAL_COMMUNITY)
Admission: RE | Disposition: A | Discharge status: Home / Self Care | Payer: Self-pay | Source: Ambulatory Visit | Attending: Cardiovascular Disease

## 2017-06-26 DIAGNOSIS — F17210 Nicotine dependence, cigarettes, uncomplicated: Secondary | ICD-10-CM | POA: Insufficient documentation

## 2017-06-26 DIAGNOSIS — I10 Essential (primary) hypertension: Secondary | ICD-10-CM | POA: Insufficient documentation

## 2017-06-26 DIAGNOSIS — Z880 Allergy status to penicillin: Secondary | ICD-10-CM | POA: Diagnosis not present

## 2017-06-26 DIAGNOSIS — F419 Anxiety disorder, unspecified: Secondary | ICD-10-CM | POA: Diagnosis not present

## 2017-06-26 DIAGNOSIS — Z6835 Body mass index (BMI) 35.0-35.9, adult: Secondary | ICD-10-CM | POA: Diagnosis not present

## 2017-06-26 DIAGNOSIS — Z8249 Family history of ischemic heart disease and other diseases of the circulatory system: Secondary | ICD-10-CM | POA: Insufficient documentation

## 2017-06-26 DIAGNOSIS — I25119 Atherosclerotic heart disease of native coronary artery with unspecified angina pectoris: Secondary | ICD-10-CM | POA: Diagnosis not present

## 2017-06-26 DIAGNOSIS — R072 Precordial pain: Secondary | ICD-10-CM

## 2017-06-26 DIAGNOSIS — Z79899 Other long term (current) drug therapy: Secondary | ICD-10-CM | POA: Insufficient documentation

## 2017-06-26 HISTORY — PX: LEFT HEART CATH AND CORONARY ANGIOGRAPHY: CATH118249

## 2017-06-26 LAB — BASIC METABOLIC PANEL
ANION GAP: 8 (ref 5–15)
BUN: 10 mg/dL (ref 6–20)
CO2: 26 mmol/L (ref 22–32)
Calcium: 8.9 mg/dL (ref 8.9–10.3)
Chloride: 107 mmol/L (ref 101–111)
Creatinine, Ser: 0.7 mg/dL (ref 0.44–1.00)
GFR calc Af Amer: >60 mL/min (ref >60)
GLUCOSE: 108 mg/dL — AB (ref 65–99)
POTASSIUM: 3.8 mmol/L (ref 3.5–5.1)
Sodium: 141 mmol/L (ref 135–145)

## 2017-06-26 LAB — PROTIME-INR
INR: 0.94
Prothrombin Time: 12.5 seconds (ref 11.4–15.2)

## 2017-06-26 SURGERY — LEFT HEART CATH AND CORONARY ANGIOGRAPHY
Anesthesia: LOCAL

## 2017-06-26 MED ORDER — SODIUM CHLORIDE 0.9% FLUSH: 3.0000 mL | Freq: Two times a day (BID) | INTRAVENOUS | Status: DC

## 2017-06-26 MED ORDER — ONDANSETRON HCL 4 MG/2ML IJ SOLN: 4.0000 mg | Freq: Once | INTRAMUSCULAR | Status: DC | PRN

## 2017-06-26 MED ORDER — HEPARIN (PORCINE) IN NACL 2-0.9 UNIT/ML-% IJ SOLN
INTRAMUSCULAR | Status: AC
  Filled 2017-06-26: qty 500

## 2017-06-26 MED ORDER — SODIUM CHLORIDE 0.9% FLUSH: 3.0000 mL | INTRAVENOUS | Status: DC | PRN

## 2017-06-26 MED ORDER — LIDOCAINE HCL (PF) 1 % IJ SOLN
INTRAMUSCULAR | Status: DC | PRN
  Administered 2017-06-26: 2 mL

## 2017-06-26 MED ORDER — SODIUM CHLORIDE 0.9 % WEIGHT BASED INFUSION
3.0000 mL/kg/h | INTRAVENOUS | Status: AC
  Administered 2017-06-26: 3 mL/kg/h via INTRAVENOUS

## 2017-06-26 MED ORDER — HEPARIN (PORCINE) IN NACL 2-0.9 UNIT/ML-% IJ SOLN
INTRAMUSCULAR | Status: AC | PRN
  Administered 2017-06-26: 1500 mL

## 2017-06-26 MED ORDER — HEPARIN SODIUM (PORCINE) 1000 UNIT/ML IJ SOLN
INTRAMUSCULAR | Status: DC | PRN
Start: 2017-06-26 — End: 2017-06-26
  Administered 2017-06-26: 5000 [IU] via INTRAVENOUS

## 2017-06-26 MED ORDER — SODIUM CHLORIDE 0.9 % WEIGHT BASED INFUSION: 1.0000 mL/kg/h | INTRAVENOUS | Status: DC

## 2017-06-26 MED ORDER — IOPAMIDOL (ISOVUE-370) INJECTION 76%
INTRAVENOUS | Status: AC
  Filled 2017-06-26: qty 100

## 2017-06-26 MED ORDER — LIDOCAINE HCL (PF) 1 % IJ SOLN
INTRAMUSCULAR | Status: AC
  Filled 2017-06-26: qty 30

## 2017-06-26 MED ORDER — ONDANSETRON HCL 4 MG/2ML IJ SOLN
INTRAMUSCULAR | Status: AC
  Filled 2017-06-26: qty 2

## 2017-06-26 MED ORDER — MIDAZOLAM HCL 2 MG/2ML IJ SOLN
INTRAMUSCULAR | Status: AC
  Filled 2017-06-26: qty 2

## 2017-06-26 MED ORDER — SODIUM CHLORIDE 0.9 % IV SOLN: 250.0000 mL | INTRAVENOUS | Status: DC | PRN

## 2017-06-26 MED ORDER — MIDAZOLAM HCL 2 MG/2ML IJ SOLN
INTRAMUSCULAR | Status: AC
Start: 2017-06-26 — End: 2017-06-26
  Filled 2017-06-26: qty 2

## 2017-06-26 MED ORDER — MIDAZOLAM HCL 2 MG/2ML IJ SOLN
INTRAMUSCULAR | Status: DC | PRN
  Administered 2017-06-26: 2 mg via INTRAVENOUS
  Administered 2017-06-26: 1 mg via INTRAVENOUS

## 2017-06-26 MED ORDER — VERAPAMIL HCL 2.5 MG/ML IV SOLN
INTRAVENOUS | Status: AC
  Filled 2017-06-26: qty 2

## 2017-06-26 MED ORDER — VERAPAMIL HCL 2.5 MG/ML IV SOLN
INTRAVENOUS | Status: DC | PRN
  Administered 2017-06-26: 10 mL via INTRA_ARTERIAL

## 2017-06-26 MED ORDER — SODIUM CHLORIDE 0.9 % IV SOLN: INTRAVENOUS | Status: DC

## 2017-06-26 MED ORDER — FENTANYL CITRATE (PF) 100 MCG/2ML IJ SOLN
INTRAMUSCULAR | Status: DC | PRN
  Administered 2017-06-26 (×2): 25 ug via INTRAVENOUS

## 2017-06-26 MED ORDER — SODIUM CHLORIDE 0.9 % IV SOLN: INTRAVENOUS | Status: AC

## 2017-06-26 MED ORDER — ASPIRIN 81 MG PO CHEW: 81.0000 mg | CHEWABLE_TABLET | ORAL | Status: DC

## 2017-06-26 MED ORDER — NITROGLYCERIN 0.4 MG SL SUBL
SUBLINGUAL_TABLET | SUBLINGUAL | Status: AC
Start: 2017-06-26 — End: 2017-06-26
  Administered 2017-06-26: 0.4 mg
  Administered 2017-06-26: 0.4 mg via SUBLINGUAL
  Filled 2017-06-26: qty 1

## 2017-06-26 MED ORDER — ACETAMINOPHEN 500 MG PO TABS
ORAL_TABLET | ORAL | Status: AC
  Filled 2017-06-26: qty 2

## 2017-06-26 MED ORDER — FENTANYL CITRATE (PF) 100 MCG/2ML IJ SOLN
INTRAMUSCULAR | Status: AC
  Filled 2017-06-26: qty 2

## 2017-06-26 MED ORDER — IOPAMIDOL (ISOVUE-370) INJECTION 76%
INTRAVENOUS | Status: DC | PRN
  Administered 2017-06-26: 100 mL via INTRA_ARTERIAL

## 2017-06-26 MED ORDER — ACETAMINOPHEN 500 MG PO TABS
1000.0000 mg | ORAL_TABLET | Freq: Four times a day (QID) | ORAL | Status: DC | PRN
  Administered 2017-06-26: 1000 mg via ORAL
  Filled 2017-06-26 (×2): qty 2

## 2017-06-26 MED ORDER — ONDANSETRON HCL 4 MG/2ML IJ SOLN
INTRAMUSCULAR | Status: DC | PRN
  Administered 2017-06-26: 4 mg via INTRAVENOUS

## 2017-06-26 MED ORDER — NITROGLYCERIN 0.4 MG SL SUBL: 0.4000 mg | SUBLINGUAL_TABLET | SUBLINGUAL | Status: DC | PRN

## 2017-06-26 SURGICAL SUPPLY — 10 items

## 2017-06-26 NOTE — Interval H&P Note (Signed)
History and Physical Interval Note:  06/26/2017 11:19 AM  Lorella Nimrod  has presented today for cardiac cath with the diagnosis of unstable angina.  The various methods of treatment have been discussed with the patient and family. After consideration of risks, benefits and other options for treatment, the patient has consented to  Procedure(s): LEFT HEART CATH AND CORONARY ANGIOGRAPHY (N/A) as a surgical intervention .  The patient's history has been reviewed, patient examined, no change in status, stable for surgery.  I have reviewed the patient's chart and labs.  Questions were answered to the patient's satisfaction.    Cath Lab Visit (complete for each Cath Lab visit)  Clinical Evaluation Leading to the Procedure:   ACS: No.  Non-ACS:    Anginal Classification: CCS III  Anti-ischemic medical therapy: Minimal Therapy (1 class of medications)  Non-Invasive Test Results: No non-invasive testing performed  Prior CABG: No previous CABG         Verne Carrow

## 2017-06-26 NOTE — Progress Notes (Signed)
States headache is still "pounding" and states does not want further pain med

## 2017-06-26 NOTE — H&P (View-Only) (Signed)
Cardiology Office Note:    Date:  06/24/2017   ID:  Sylvia Walker, DOB [birth year], MRN [id number]  PCP:  Ronney LionHazy, Marian, NP  Cardiologist:  Garwin Brothersajan R Perkins Molina, MD   Referring MD: Ronney LionHazy, Marian, NP    ASSESSMENT:    1. Angina pectoris (HCC)   2. Essential hypertension   3. Morbid obesity (HCC)   4. Atherosclerotic vascular disease   5. Cigarette smoker    PLAN:    In order of problems listed above:  1. Patient's symptoms are very concerning. She has multiple risk factors for coronary artery disease. Sublingual nitroglycerin protocol 911 protocol explained and she verbalized understanding. Prescription was sent.In view of the patient's symptoms, I discussed with the patient options for evaluation. Invasive and noninvasive options were given to the patient. I discussed stress testing and coronary angiography and left heart catheterization at length. Benefits, pros and cons of each approach were discussed at length. Patient had multiple questions which were answered to the patient's satisfaction. Patient opted for invasive evaluation and we will set up for coronary angiography and left heart catheterization. Further recommendations will be made based on the findings with coronary angiography. In the interim if the patient has any significant symptoms in hospital to the nearest emergency room. 2. Her blood pressure stable at this time. Diet was discussed for obesity and this of obesity explained she localized understanding. Her lipids need to be followed by her primary care physician. Once the coronary angiography stent she needs to be on statin therapy and this will have to be addressed by her primary care physician. I told her to take a coated baby aspirin on a regular basis. She'll be seen in follow-up appointment after the coronary angiography. Primary prevention stressed. I advised her to diet to lose weight and risks of obesity explained. She vocalized understanding and questions were  answered to her satisfaction. 3.   I spent 5 minutes with the patient discussing solely about smoking. Smoking cessation was counseled. I suggested to the patient also different medications and pharmacological interventions. Patient is keen to try stopping on its own at this time. He will get back to me if he needs any further assistance in this matter.   Medication Adjustments/Labs and Tests Ordered: Current medicines are reviewed at length with the patient today.  Concerns regarding medicines are outlined above.  No orders of the defined types were placed in this encounter.  No orders of the defined types were placed in this encounter.    History of Present Illness:    Sylvia Walker is a 46 y.o. female who is being seen today for the evaluatiIs on of Angina pectoris at the request of Ronney LionHazy, Marian, NP. Patient is a pleasant 46 year old female. 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 She mentions to me that she has history of essential hypertension and also she has morbid obesity. She leads a sedentary lifestyle. Unfortunately she's been a heavy smoker. This is been for a very long period of time. She mentioned to me that the other day she was at a party and subsequently when she got home she has substernal chest tightness. She uses the words elephants sitting on the chest describe this. This went to the neck and to the arms. EMS was called and she was evaluated. I reviewed the hospital evaluation. CT scan of the chest ruled out pulmonary embolism but revealed atherosclerotic vascular disease and the patient is aware of it. At the time of my  evaluation she is alert awake oriented and in no distress. Her husband is accompanying her and is very supportive.  Past Medical History:  Diagnosis Date  . ADD (attention deficit disorder)   . Anxiety   . Hypertension     Past Surgical History:  Procedure Laterality Date  . ABDOMINAL HYSTERECTOMY    . APPENDECTOMY    . ELBOW SURGERY      x2  . MICROLARYNGOSCOPY WITH CO2 LASER AND EXCISION OF VOCAL CORD LESION    . VENTRICULOPERITONEAL SHUNT      Current Medications: Current Meds  Medication Sig  . ALPRAZolam (XANAX) 0.5 MG tablet Take 0.5 mg by mouth daily as needed for anxiety.   Marland Kitchen amLODipine (NORVASC) 5 MG tablet Take 5 mg by mouth daily.  . cyclobenzaprine (FLEXERIL) 10 MG tablet Take 10 mg by mouth as needed for muscle spasms.  . diphenhydrAMINE (BENADRYL) 25 mg capsule Take 25 mg by mouth at bedtime.  Marland Kitchen estradiol (ESTRACE) 1 MG tablet Take 1 mg by mouth daily.   Marland Kitchen gabapentin (NEURONTIN) 300 MG capsule Take 300 mg by mouth daily.   Marland Kitchen lisdexamfetamine (VYVANSE) 30 MG capsule Take 30 mg by mouth daily.  . Multiple Vitamin (MULTIVITAMIN WITH MINERALS) TABS tablet Take 1 tablet by mouth daily.  Marland Kitchen omeprazole (PRILOSEC) 20 MG capsule Take 1 capsule (20 mg total) by mouth daily.  . Prenatal Vit-Fe Fumarate-FA (PRENATAL MULTIVITAMIN) TABS tablet Take 1 tablet by mouth daily at 12 noon.  . sertraline (ZOLOFT) 100 MG tablet Take 100 mg by mouth daily.  . traMADol (ULTRAM) 50 MG tablet Take by mouth as needed for severe pain.  . traZODone (DESYREL) 100 MG tablet Take 100 mg by mouth at bedtime.     Allergies:   Penicillins   Social History   Social History  . Marital status: Married    Spouse name: N/A  . Number of children: N/A  . Years of education: N/A   Social History Main Topics  . Smoking status: Current Every Day Smoker    Packs/day: 1.00  . Smokeless tobacco: Never Used  . Alcohol use Yes  . Drug use: No  . Sexual activity: Not Asked   Other Topics Concern  . None   Social History Narrative  . None     Family History: The patient's family history includes COPD in her mother; Hypertension in her mother; Prostate cancer in her father; Renal Disease in her mother; Stroke in her mother.  ROS:   Please see the history of present illness.    All other systems reviewed and are  negative.  EKGs/Labs/Other Studies Reviewed:    The following studies were reviewed today: I reviewed records from the emergency room visit and discussed this with the patient at extensive length. She had multiple questions which were answered to her satisfaction.   Recent Labs: 06/21/2017: BUN 13; Creatinine, Ser 0.90; Hemoglobin 13.6; Platelets 254; Potassium 3.2; Sodium 141  Recent Lipid Panel No results found for: CHOL, TRIG, HDL, CHOLHDL, VLDL, LDLCALC, LDLDIRECT  Physical Exam:    VS:  BP 124/68   Pulse 96   Resp 10   Ht 5\' 7"  (1.702 m)   Wt 224 lb (101.6 kg)   BMI 35.08 kg/m     Wt Readings from Last 3 Encounters:  06/24/17 224 lb (101.6 kg)     GEN: Patient is in no acute distress HEENT: Normal NECK: No JVD; No carotid bruits LYMPHATICS: No lymphadenopathy CARDIAC: S1 S2 regular, 2/6  systolic murmur at the apex. RESPIRATORY:  Clear to auscultation without rales, wheezing or rhonchi  ABDOMEN: Soft, non-tender, non-distended MUSCULOSKELETAL:  No edema; No deformity  SKIN: Warm and dry NEUROLOGIC:  Alert and oriented x 3 PSYCHIATRIC:  Normal affect    Signed, Garwin Brothers, MD  06/24/2017 11:15 AM    [facility]

## 2017-06-26 NOTE — Progress Notes (Signed)
Paged by [facility] nurse that patient was given SL NTG and now has headache with nausea. Vitals stable. Should be going for cath shortly. Will rx Tylenol and Zofran. Held off opiates since she will be getting meds soon for cath. If cath ends up being delayed may need to order something different. Christiana Gurevich PA-C

## 2017-06-26 NOTE — Progress Notes (Signed)
At approximately 1130 during pt checklist when I came to the pain assessment, pt told me that she was having chest pains at a level 4-5. States that this is how she has felt at home. Also told me that she has never picked up her prescription for Nitroglycerin. Emergency orders started. O2 on at 2 liters. O2 sats running 99-100%. EKG completed. We were in the process of accessing her IV at the time. Nitroglycerin first given at 1135. After 5 minutes pt states pain at a level 4.  BP 115/71 Second nitro given at 1145. By 1153 BP went down to 103/72 and pt states she no longer is having chest pains, although she does have a headache which she has had all day but worsened after the nitro. I reported off to Estill Dooms, RN

## 2017-06-26 NOTE — Discharge Instructions (Signed)

## 2017-06-29 ENCOUNTER — Encounter (HOSPITAL_COMMUNITY): Payer: Self-pay | Admitting: Cardiovascular Disease

## 2017-06-30 DIAGNOSIS — I10 Essential (primary) hypertension: Secondary | ICD-10-CM | POA: Diagnosis not present

## 2017-06-30 DIAGNOSIS — K219 Gastro-esophageal reflux disease without esophagitis: Secondary | ICD-10-CM | POA: Diagnosis not present

## 2017-06-30 DIAGNOSIS — W57XXXA Bitten or stung by nonvenomous insect and other nonvenomous arthropods, initial encounter: Secondary | ICD-10-CM | POA: Diagnosis not present

## 2017-07-03 ENCOUNTER — Ambulatory Visit (INDEPENDENT_AMBULATORY_CARE_PROVIDER_SITE_OTHER): Payer: 59 | Admitting: Cardiology

## 2017-07-03 ENCOUNTER — Encounter: Payer: Self-pay | Admitting: Cardiology

## 2017-07-03 VITALS — BP 136/76 | HR 116 | Ht 67.0 in | Wt 222.1 lb

## 2017-07-03 DIAGNOSIS — K219 Gastro-esophageal reflux disease without esophagitis: Secondary | ICD-10-CM | POA: Diagnosis not present

## 2017-07-03 DIAGNOSIS — F17210 Nicotine dependence, cigarettes, uncomplicated: Secondary | ICD-10-CM | POA: Diagnosis not present

## 2017-07-03 DIAGNOSIS — Z1211 Encounter for screening for malignant neoplasm of colon: Secondary | ICD-10-CM | POA: Diagnosis not present

## 2017-07-03 DIAGNOSIS — I7090 Unspecified atherosclerosis: Secondary | ICD-10-CM | POA: Diagnosis not present

## 2017-07-03 DIAGNOSIS — I10 Essential (primary) hypertension: Secondary | ICD-10-CM

## 2017-07-03 DIAGNOSIS — R1310 Dysphagia, unspecified: Secondary | ICD-10-CM | POA: Diagnosis not present

## 2017-07-03 DIAGNOSIS — Z800 Family history of malignant neoplasm of digestive organs: Secondary | ICD-10-CM | POA: Diagnosis not present

## 2017-07-03 MED FILL — PROMETHAZINE 12.5 MG TABLET: 12.5 | 1 days supply | Qty: 2 | Fill #0

## 2017-07-03 MED FILL — METOCLOPRAMIDE 5 MG TABLET: 5 | 1 days supply | Qty: 2 | Fill #0

## 2017-07-03 MED FILL — SUPREP BOWEL PREP KIT: 17.5-3.13-1 | 1 days supply | Qty: 354 | Fill #0

## 2017-07-03 NOTE — Progress Notes (Signed)
Cardiology Office Note:    Date:  07/03/2017   ID:  ANNMARGARET DECAPRIO, DOB 1971-02-28, MRN [id number]  PCP:  Ronney Lion, NP  Cardiologist:  Garwin Brothers, MD   Referring MD: Ronney Lion, NP    ASSESSMENT:    1. Atherosclerotic vascular disease   2. Essential hypertension   3. Cigarette smoker   4. Morbid obesity (HCC)    PLAN:    In order of problems listed above:  1. Secondary prevention stressed to the patient. Importance of compliance with diet and medications stressed. Coronary angiography report was discussed with her at length. She needs to get started with statin therapy and we will have blood work done on her. Accordingly we will initiated on lipid-lowering therapy. Benefits and potential risks explained to her and she verbalized understanding. 2. Diet was discussed with dyslipidemia and for obesity. Questions were answered to her satisfaction. She plans to start an exercise program. She'll be seen in follow-up appointment in 6 months or earlier if she has any concerns.   Medication Adjustments/Labs and Tests Ordered: Current medicines are reviewed at length with the patient today.  Concerns regarding medicines are outlined above.  No orders of the defined types were placed in this encounter.  No orders of the defined types were placed in this encounter.    Chief Complaint  Patient presents with  . Follow-up    post Cath      History of Present Illness:    Sylvia Walker is a 46 y.o. female. Patient underwent coronary angiography and was found to have nonobstructive disease. She is happy about this. She wants to take better care of shows quit smoking completely. At the time of my evaluation she is alert awake oriented and in no distress.  Past Medical History:  Diagnosis Date  . ADD (attention deficit disorder)   . Anxiety   . Hypertension     Past Surgical History:  Procedure Laterality Date  . ABDOMINAL HYSTERECTOMY    . APPENDECTOMY    .  ELBOW SURGERY     x2  . LEFT HEART CATH AND CORONARY ANGIOGRAPHY N/A 06/26/2017   Procedure: LEFT HEART CATH AND CORONARY ANGIOGRAPHY;  Surgeon: Kathleene Hazel, MD;  Location: [facility] INVASIVE CV LAB;  Service: Cardiovascular;  Laterality: N/A;  . MICROLARYNGOSCOPY WITH CO2 LASER AND EXCISION OF VOCAL CORD LESION    . VENTRICULOPERITONEAL SHUNT      Current Medications: Current Meds  Medication Sig  . acetaminophen (TYLENOL) 500 MG tablet Take 1,000 mg by mouth 2 (two) times daily as needed for moderate pain or headache.  . ALPRAZolam (XANAX) 0.5 MG tablet Take 0.5 mg by mouth daily as needed for anxiety.   Marland Kitchen amLODipine (NORVASC) 5 MG tablet Take 5 mg by mouth daily.  Marland Kitchen aspirin 81 MG chewable tablet Chew 1 tablet (81 mg total) by mouth daily.  . cyclobenzaprine (FLEXERIL) 10 MG tablet Take 10 mg by mouth daily as needed for muscle spasms.   . diphenhydrAMINE (BENADRYL) 25 mg capsule Take 25 mg by mouth at bedtime.  Marland Kitchen estradiol (ESTRACE) 1 MG tablet Take 1 mg by mouth daily.   Marland Kitchen gabapentin (NEURONTIN) 300 MG capsule Take 300 mg by mouth at bedtime.   Marland Kitchen ibuprofen (ADVIL,MOTRIN) 200 MG tablet Take 400 mg by mouth 2 (two) times daily as needed for headache or moderate pain.  Marland Kitchen lisdexamfetamine (VYVANSE) 30 MG capsule Take 30 mg by mouth daily.  . Multiple Vitamin (MULTIVITAMIN WITH MINERALS)  TABS tablet Take 1 tablet by mouth daily.  . nitroGLYCERIN (NITROSTAT) 0.4 MG SL tablet Place 1 tablet (0.4 mg total) under the tongue every 5 (five) minutes as needed for chest pain.  . pantoprazole (PROTONIX) 40 MG tablet Take 40 mg by mouth daily.  . Prenatal Vit-Fe Fumarate-FA (PRENATAL MULTIVITAMIN) TABS tablet Take 1 tablet by mouth daily.   . sertraline (ZOLOFT) 100 MG tablet Take 100 mg by mouth daily.  . traMADol (ULTRAM) 50 MG tablet Take 50 mg by mouth 3 (three) times daily as needed for severe pain.   . traZODone (DESYREL) 100 MG tablet Take 100 mg by mouth at bedtime.     Allergies:    Penicillins   Social History   Social History  . Marital status: Married    Spouse name: N/A  . Number of children: N/A  . Years of education: N/A   Social History Main Topics  . Smoking status: Current Every Day Smoker    Packs/day: 1.00  . Smokeless tobacco: Never Used  . Alcohol use Yes  . Drug use: No  . Sexual activity: Not Asked   Other Topics Concern  . None   Social History Narrative  . None     Family History: The patient's family history includes COPD in her mother; Hypertension in her mother; Prostate cancer in her father; Renal Disease in her mother; Stroke in her mother.  ROS:   Please see the history of present illness.    All other systems reviewed and are negative.  EKGs/Labs/Other Studies Reviewed:    The following studies were reviewed today: I reviewed records and coronary angiography report with the patient at extensive length and questions were answered to her satisfaction.   Recent Labs: 06/21/2017: Hemoglobin 13.6; Platelets 254 06/26/2017: BUN 10; Creatinine, Ser 0.70; Potassium 3.8; Sodium 141  Recent Lipid Panel No results found for: CHOL, TRIG, HDL, CHOLHDL, VLDL, LDLCALC, LDLDIRECT  Physical Exam:    VS:  BP 136/76   Pulse (!) 116   Ht 5\' 7"  (1.702 m)   Wt 222 lb 1.3 oz (100.7 kg)   SpO2 96%   BMI 34.78 kg/m     Wt Readings from Last 3 Encounters:  07/03/17 222 lb 1.3 oz (100.7 kg)  06/26/17 224 lb (101.6 kg)  06/24/17 224 lb (101.6 kg)     GEN: Patient is in no acute distress HEENT: Normal NECK: No JVD; No carotid bruits LYMPHATICS: No lymphadenopathy CARDIAC: Hear sounds regular, 2/6 systolic murmur at the apex. RESPIRATORY:  Clear to auscultation without rales, wheezing or rhonchi  ABDOMEN: Soft, non-tender, non-distended MUSCULOSKELETAL:  No edema; No deformity  SKIN: Warm and dry NEUROLOGIC:  Alert and oriented x 3 PSYCHIATRIC:  Normal affect   Signed, Garwin Brothers, MD  07/03/2017 11:37 AM    [facility]

## 2017-07-03 NOTE — Patient Instructions (Addendum)
Medication Instructions:   Your physician recommends that you continue on your current medications as directed. Please refer to the Current Medication list given to you today.   Labwork:  Your physician recommends that you return for lab work at your work fasting for liver lipid panel.     Testing/Procedures:   None  Follow-Up:  Your physician recommends that you schedule a follow-up appointment in: 6 months with Dr. Tomie China.    Any Other Special Instructions Will Be Listed Below (If Applicable).     If you need a refill on your cardiac medications before your next appointment, please call your pharmacy.

## 2017-07-20 DIAGNOSIS — R05 Cough: Secondary | ICD-10-CM | POA: Diagnosis not present

## 2017-07-20 DIAGNOSIS — R197 Diarrhea, unspecified: Secondary | ICD-10-CM | POA: Diagnosis not present

## 2017-07-20 DIAGNOSIS — N959 Unspecified menopausal and perimenopausal disorder: Secondary | ICD-10-CM | POA: Diagnosis not present

## 2017-07-20 DIAGNOSIS — R0981 Nasal congestion: Secondary | ICD-10-CM | POA: Diagnosis not present

## 2017-07-20 DIAGNOSIS — R062 Wheezing: Secondary | ICD-10-CM | POA: Diagnosis not present

## 2017-07-21 MED FILL — GABAPENTIN 300 MG CAPSULE: 300 | 30 days supply | Qty: 30 | Fill #1 | Status: TO

## 2017-07-21 MED FILL — PANTOPRAZOLE SOD DR 40 MG T: 40 | 30 days supply | Qty: 30 | Fill #0

## 2017-07-22 MED FILL — SERTRALINE HCL 100 MG TAB: 100 | 30 days supply | Qty: 30 | Fill #0

## 2017-07-22 MED FILL — VYVANSE 30 MG CAPSULE: 30 | 30 days supply | Qty: 30 | Fill #0

## 2017-07-22 MED FILL — ALPRAZolam 0.5 MG TABS: 0.5 | 23 days supply | Qty: 45 | Fill #0

## 2017-07-28 DIAGNOSIS — K635 Polyp of colon: Secondary | ICD-10-CM | POA: Diagnosis not present

## 2017-07-28 DIAGNOSIS — Z01818 Encounter for other preprocedural examination: Secondary | ICD-10-CM | POA: Diagnosis not present

## 2017-07-28 DIAGNOSIS — Z1211 Encounter for screening for malignant neoplasm of colon: Secondary | ICD-10-CM | POA: Diagnosis not present

## 2017-07-28 DIAGNOSIS — A048 Other specified bacterial intestinal infections: Secondary | ICD-10-CM | POA: Diagnosis not present

## 2017-07-28 DIAGNOSIS — K253 Acute gastric ulcer without hemorrhage or perforation: Secondary | ICD-10-CM | POA: Diagnosis not present

## 2017-07-31 DIAGNOSIS — K317 Polyp of stomach and duodenum: Secondary | ICD-10-CM | POA: Diagnosis not present

## 2017-08-01 DIAGNOSIS — K219 Gastro-esophageal reflux disease without esophagitis: Secondary | ICD-10-CM | POA: Diagnosis not present

## 2017-08-03 DIAGNOSIS — K529 Noninfective gastroenteritis and colitis, unspecified: Secondary | ICD-10-CM | POA: Diagnosis not present

## 2017-08-04 DIAGNOSIS — K529 Noninfective gastroenteritis and colitis, unspecified: Secondary | ICD-10-CM | POA: Diagnosis not present

## 2017-08-05 DIAGNOSIS — Z6839 Body mass index (BMI) 39.0-39.9, adult: Secondary | ICD-10-CM | POA: Diagnosis not present

## 2017-08-05 DIAGNOSIS — E6609 Other obesity due to excess calories: Secondary | ICD-10-CM | POA: Diagnosis not present

## 2017-08-05 DIAGNOSIS — M5002 Cervical disc disorder with myelopathy, mid-cervical region, unspecified level: Secondary | ICD-10-CM | POA: Diagnosis not present

## 2017-08-05 DIAGNOSIS — M7711 Lateral epicondylitis, right elbow: Secondary | ICD-10-CM | POA: Diagnosis not present

## 2017-08-05 DIAGNOSIS — M1711 Unilateral primary osteoarthritis, right knee: Secondary | ICD-10-CM | POA: Diagnosis not present

## 2017-08-08 DIAGNOSIS — K635 Polyp of colon: Secondary | ICD-10-CM | POA: Diagnosis not present

## 2018-02-17 IMAGING — CT CT ANGIO CHEST-ABD-PELV FOR DISSECTION W/ AND WO/W CM
2 of 7 series · 13 of 46 positions shown, 15 images · IV contrast (isovue)
Comparison: Chest radiograph performed earlier today at [DATE] a.m.

CLINICAL DATA: Acute onset of substernal chest pain, radiating to
the right arm and back. Initial encounter.

EXAM:
CT ANGIOGRAPHY CHEST, ABDOMEN AND PELVIS
TECHNIQUE: Multidetector CT imaging through the chest, abdomen and pelvis was
performed using the standard protocol during bolus administration of
intravenous contrast. Multiplanar reconstructed images and MIPs were
obtained and reviewed to evaluate the vascular anatomy.
CONTRAST:  100 mL of Isovue 370 IV contrast

[Series 7: dissection 2mm · axial · 0.95mm/px · z∈[+676,+1284]mm · 10 of 344 slices shown, 12 images]
[im 20/344  soft-tissue]
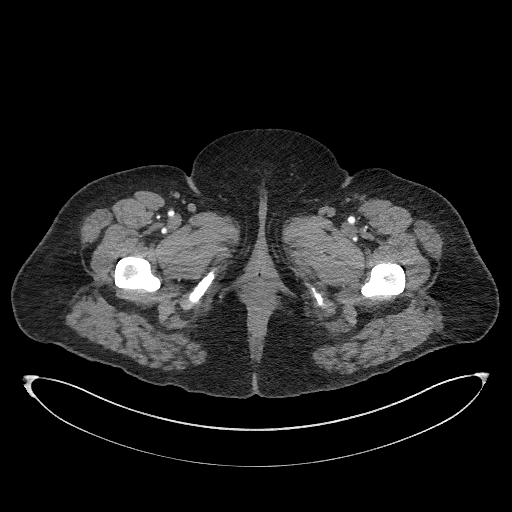
[im 20/344  bone]
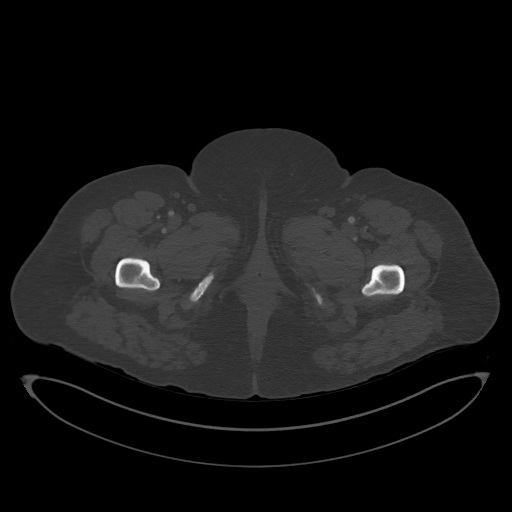
[im 58/344  soft-tissue]
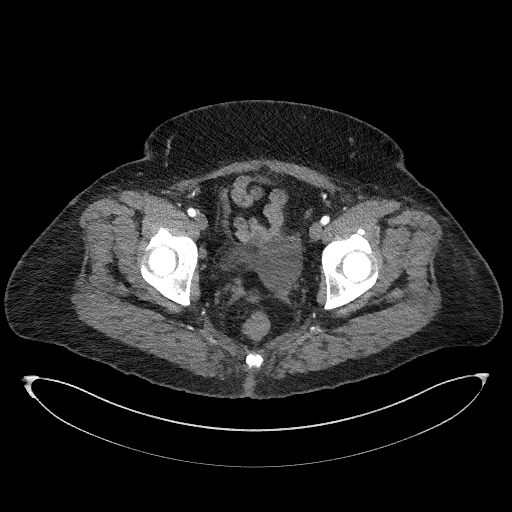
[im 96/344  soft-tissue]
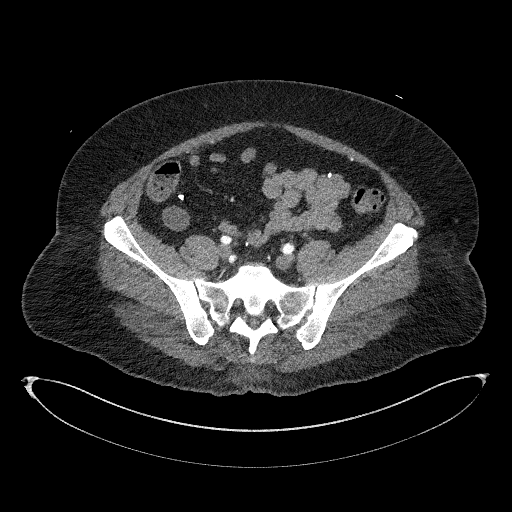
[im 115/344  soft-tissue]
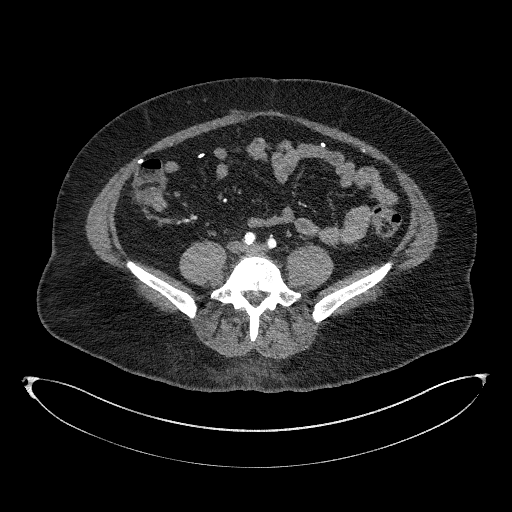
[im 153/344  soft-tissue]
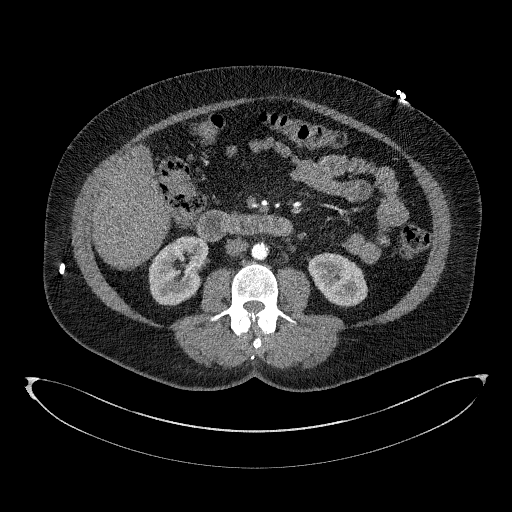
[im 191/344  soft-tissue]
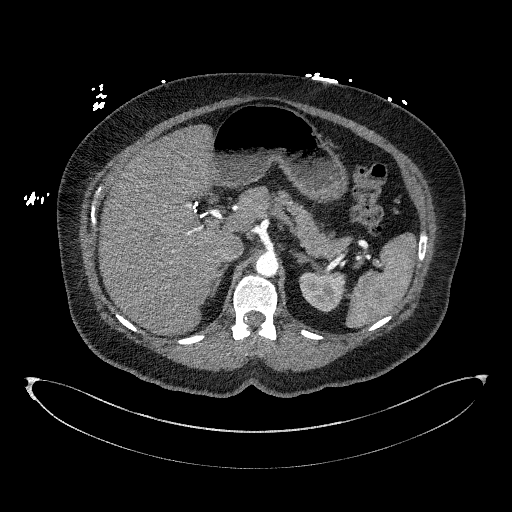
[im 229/344  soft-tissue]
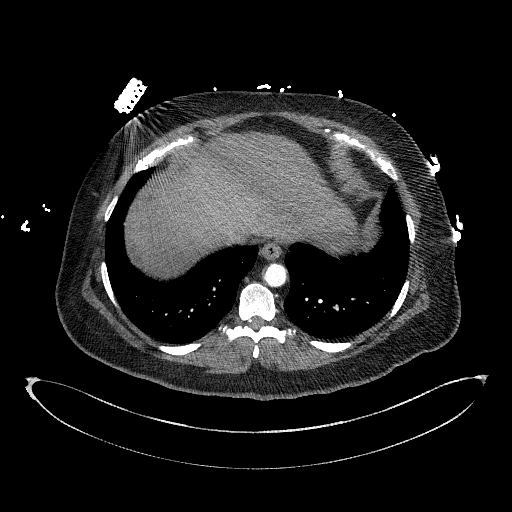
[im 248/344  soft-tissue]
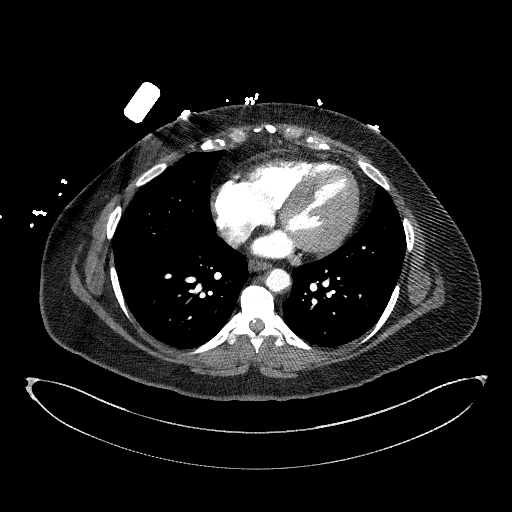
[im 286/344  soft-tissue]
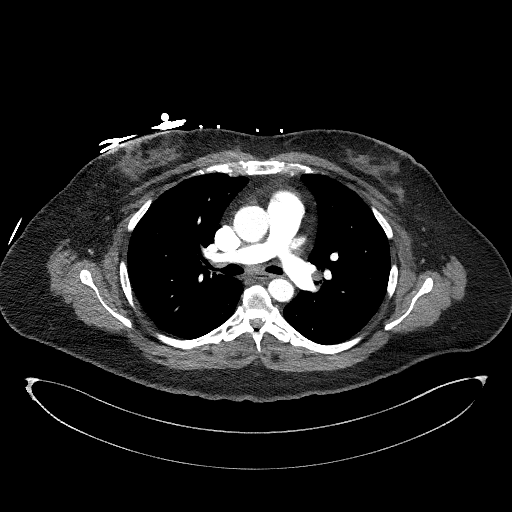
[im 286/344  bone]
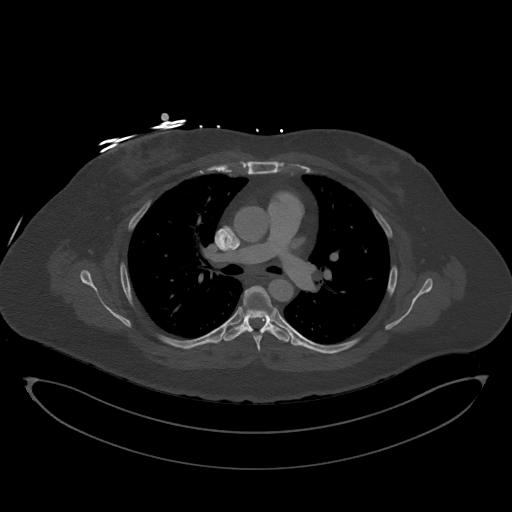
[im 324/344  soft-tissue]
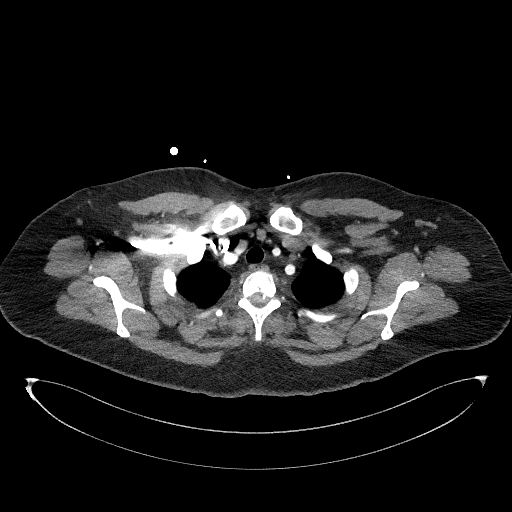

[Series 10: dissection 2mm cor · coronal · 0.81mm/px · 3 of 155 slices shown]
[im 39/155  soft-tissue]
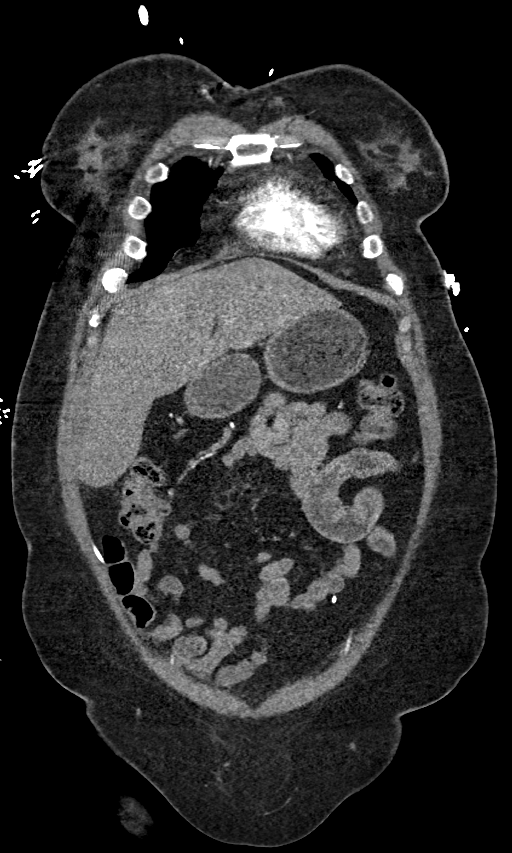
[im 78/155  soft-tissue]
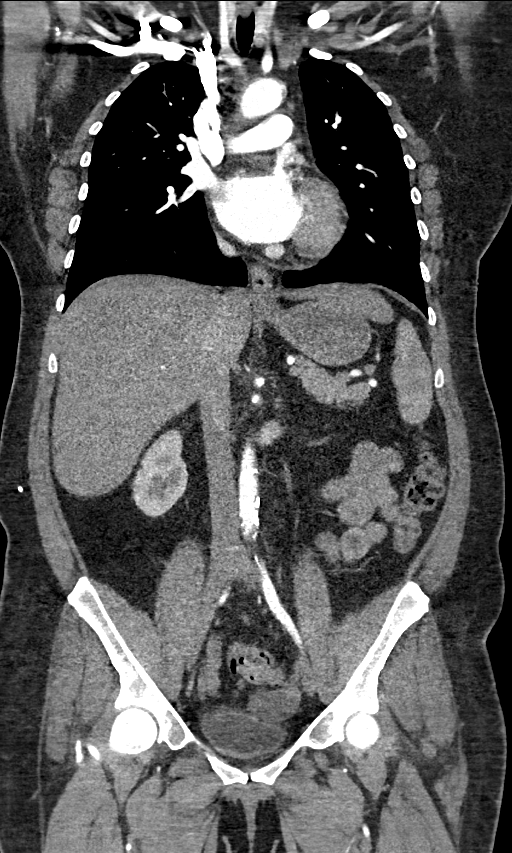
[im 116/155  soft-tissue]
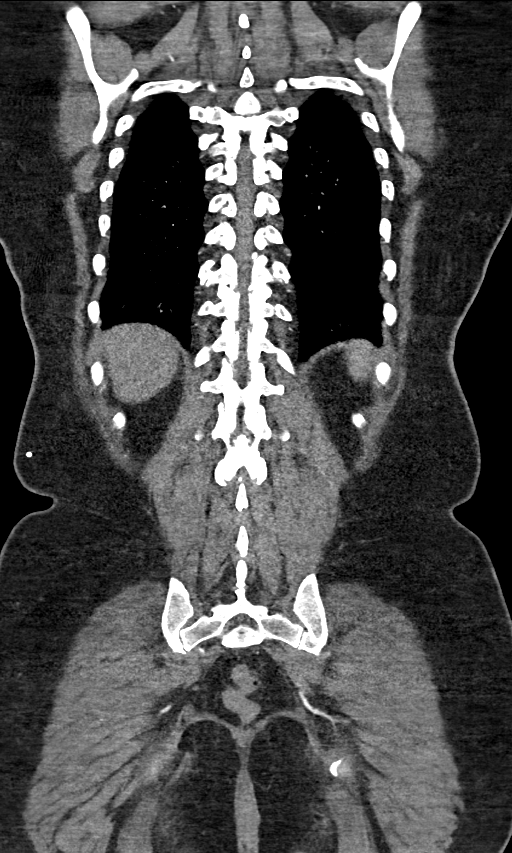

[13 of 46 positions shown; findings below may reference images not displayed]

FINDINGS: CTA CHEST FINDINGS

Cardiovascular: There is no evidence of aortic dissection. There is
no evidence of aneurysmal dilatation. Mild calcification is noted
along the aortic arch.

There is no evidence of significant pulmonary embolus.

Mediastinum/Nodes: The heart is normal in size. Visualized
mediastinal nodes are borderline normal in size. No pericardial
effusion is identified. The visualized portions of the thyroid gland
are unremarkable. No axillary lymphadenopathy is seen.

Lungs/Pleura: The lungs are clear bilaterally. No focal
consolidation, pleural effusion or pneumothorax is seen. No masses
are identified.

Musculoskeletal: No acute osseous abnormalities are identified. The
visualized musculature is unremarkable in appearance.

Review of the MIP images confirms the above findings.

CTA ABDOMEN AND PELVIS FINDINGS

VASCULAR

Aorta: There is no evidence of aortic dissection. There is no
evidence of aneurysmal dilatation. Mild scattered calcification is
noted along the distal abdominal aorta and its branches, with
minimal mural thrombus. No significant luminal narrowing is seen.

Celiac: The celiac trunk appears intact.

SMA: The superior mesenteric artery is unremarkable in appearance.

Renals: The renal arteries appear intact bilaterally. Two left-sided
renal arteries are seen.

IMA: The inferior mesenteric artery is unremarkable in appearance.

Inflow: Scattered calcification is noted along the common and
internal iliac arteries bilaterally. The external iliac arteries
appear intact bilaterally. Minimal calcification is noted along the
common femoral arteries bilaterally.

Veins: Visualized venous structures are grossly unremarkable in
appearance.

Review of the MIP images confirms the above findings.

NON-VASCULAR

Hepatobiliary: The liver is unremarkable in appearance. The patient
is status post cholecystectomy, with clips noted at the gallbladder
fossa. The common bile duct remains normal in caliber.

Pancreas: The pancreas is within normal limits.

Spleen: The spleen is unremarkable in appearance.

Adrenals/Urinary Tract: The adrenal glands are unremarkable in
appearance. The kidneys are within normal limits. There is no
evidence of hydronephrosis. No renal or ureteral stones are
identified. No perinephric stranding is seen.

Stomach/Bowel: The stomach is unremarkable in appearance. The small
bowel is within normal limits. The patient is status post
appendectomy. The colon is unremarkable in appearance.

Lymphatic: No retroperitoneal or pelvic sidewall lymphadenopathy is
seen.

Reproductive: The bladder is mildly distended. Mild soft tissue
inflammation about the bladder may reflect cystitis. There is
asymmetric prominence of the left ovary, measuring 5.1 cm in size.
The right ovary is unremarkable in appearance. The patient is status
post hysterectomy.

Other: A catheter is seen extending into the upper pelvis. Its
proximal end extends to the upper thoracic spine.

Musculoskeletal: No acute osseous abnormalities are identified.
Vacuum phenomenon is noted at L4-L5. The visualized musculature is
unremarkable in appearance.

Review of the MIP images confirms the above findings.
IMPRESSION: 1. No evidence of aortic dissection. No evidence of aneurysmal
dilatation.
2. No evidence of significant pulmonary embolus.
3. Mild soft tissue inflammation about the bladder may reflect
cystitis. Would correlate for any associated symptoms.
4. Asymmetric prominence of the left ovary, measuring 5.1 cm in
size. Pelvic ultrasound could be considered for further evaluation,
when and as deemed clinically appropriate.
5. Mild aortic atherosclerosis.
